# Patient Record
Sex: Male | Born: 1960 | Race: Black or African American | Hispanic: No | Smoking: Never smoker
Health system: Southern US, Community
[De-identification: ages and names within clinical notes are randomized; demographics above are authoritative.]

## PROBLEM LIST (undated history)

## (undated) DIAGNOSIS — I639 Cerebral infarction, unspecified: Secondary | ICD-10-CM

## (undated) DIAGNOSIS — J449 Chronic obstructive pulmonary disease, unspecified: Secondary | ICD-10-CM

## (undated) DIAGNOSIS — I2510 Atherosclerotic heart disease of native coronary artery without angina pectoris: Secondary | ICD-10-CM

## (undated) DIAGNOSIS — Z955 Presence of coronary angioplasty implant and graft: Secondary | ICD-10-CM

## (undated) HISTORY — PX: CORONARY STENT PLACEMENT: SHX1402

---

## 2001-03-03 ENCOUNTER — Emergency Department (HOSPITAL_COMMUNITY): Admission: EM | Admit: 2001-03-03 | Discharge: 2001-03-03 | Payer: Self-pay | Admitting: Emergency Medicine

## 2001-04-01 ENCOUNTER — Inpatient Hospital Stay (HOSPITAL_COMMUNITY): Admission: EM | Admit: 2001-04-01 | Discharge: 2001-04-03 | Payer: Self-pay | Admitting: Emergency Medicine

## 2001-04-01 ENCOUNTER — Encounter: Payer: Self-pay | Admitting: Emergency Medicine

## 2001-04-02 ENCOUNTER — Encounter: Payer: Self-pay | Admitting: Pulmonary Disease

## 2001-04-03 ENCOUNTER — Encounter: Payer: Self-pay | Admitting: Pulmonary Disease

## 2001-10-03 ENCOUNTER — Emergency Department (HOSPITAL_COMMUNITY): Admission: EM | Admit: 2001-10-03 | Discharge: 2001-10-03 | Payer: Self-pay | Admitting: Emergency Medicine

## 2002-04-09 ENCOUNTER — Emergency Department (HOSPITAL_COMMUNITY): Admission: EM | Admit: 2002-04-09 | Discharge: 2002-04-10 | Payer: Self-pay | Admitting: Emergency Medicine

## 2002-04-10 ENCOUNTER — Encounter: Payer: Self-pay | Admitting: Emergency Medicine

## 2002-04-29 ENCOUNTER — Ambulatory Visit (HOSPITAL_COMMUNITY): Admission: RE | Admit: 2002-04-29 | Discharge: 2002-04-29 | Payer: Self-pay | Admitting: Critical Care Medicine

## 2002-04-29 ENCOUNTER — Encounter: Payer: Self-pay | Admitting: Critical Care Medicine

## 2002-05-12 ENCOUNTER — Encounter: Payer: Self-pay | Admitting: Thoracic Surgery

## 2002-05-12 ENCOUNTER — Ambulatory Visit (HOSPITAL_COMMUNITY): Admission: RE | Admit: 2002-05-12 | Discharge: 2002-05-12 | Payer: Self-pay | Admitting: Thoracic Surgery

## 2002-08-18 ENCOUNTER — Inpatient Hospital Stay (HOSPITAL_COMMUNITY): Admission: EM | Admit: 2002-08-18 | Discharge: 2002-08-22 | Payer: Self-pay | Admitting: *Deleted

## 2002-08-18 ENCOUNTER — Encounter: Payer: Self-pay | Admitting: Emergency Medicine

## 2002-08-18 ENCOUNTER — Encounter: Payer: Self-pay | Admitting: Critical Care Medicine

## 2002-08-19 ENCOUNTER — Encounter: Payer: Self-pay | Admitting: Gastroenterology

## 2002-08-19 ENCOUNTER — Encounter: Payer: Self-pay | Admitting: Critical Care Medicine

## 2002-09-01 ENCOUNTER — Emergency Department (HOSPITAL_COMMUNITY): Admission: RE | Admit: 2002-09-01 | Discharge: 2002-09-01 | Payer: Self-pay | Admitting: Critical Care Medicine

## 2002-09-01 ENCOUNTER — Encounter: Payer: Self-pay | Admitting: Emergency Medicine

## 2002-09-01 ENCOUNTER — Encounter: Payer: Self-pay | Admitting: Critical Care Medicine

## 2002-09-06 ENCOUNTER — Encounter: Payer: Self-pay | Admitting: Emergency Medicine

## 2002-09-06 ENCOUNTER — Emergency Department (HOSPITAL_COMMUNITY): Admission: EM | Admit: 2002-09-06 | Discharge: 2002-09-06 | Payer: Self-pay | Admitting: Emergency Medicine

## 2002-09-07 ENCOUNTER — Inpatient Hospital Stay (HOSPITAL_COMMUNITY): Admission: EM | Admit: 2002-09-07 | Discharge: 2002-09-08 | Payer: Self-pay | Admitting: Emergency Medicine

## 2002-09-07 ENCOUNTER — Encounter: Payer: Self-pay | Admitting: *Deleted

## 2002-09-27 ENCOUNTER — Emergency Department (HOSPITAL_COMMUNITY): Admission: EM | Admit: 2002-09-27 | Discharge: 2002-09-28 | Payer: Self-pay | Admitting: Emergency Medicine

## 2002-09-28 ENCOUNTER — Encounter: Payer: Self-pay | Admitting: Emergency Medicine

## 2002-10-10 ENCOUNTER — Emergency Department (HOSPITAL_COMMUNITY): Admission: EM | Admit: 2002-10-10 | Discharge: 2002-10-10 | Payer: Self-pay | Admitting: *Deleted

## 2002-10-10 ENCOUNTER — Encounter: Payer: Self-pay | Admitting: *Deleted

## 2003-02-14 ENCOUNTER — Encounter: Payer: Self-pay | Admitting: *Deleted

## 2003-02-14 ENCOUNTER — Emergency Department (HOSPITAL_COMMUNITY): Admission: AD | Admit: 2003-02-14 | Discharge: 2003-02-14 | Payer: Self-pay | Admitting: *Deleted

## 2003-02-15 ENCOUNTER — Emergency Department (HOSPITAL_COMMUNITY): Admission: EM | Admit: 2003-02-15 | Discharge: 2003-02-15 | Payer: Self-pay | Admitting: Emergency Medicine

## 2003-03-21 ENCOUNTER — Emergency Department (HOSPITAL_COMMUNITY): Admission: EM | Admit: 2003-03-21 | Discharge: 2003-03-22 | Payer: Self-pay | Admitting: *Deleted

## 2003-03-21 ENCOUNTER — Encounter: Payer: Self-pay | Admitting: *Deleted

## 2003-03-22 ENCOUNTER — Encounter: Payer: Self-pay | Admitting: Emergency Medicine

## 2003-03-22 ENCOUNTER — Emergency Department (HOSPITAL_COMMUNITY): Admission: EM | Admit: 2003-03-22 | Discharge: 2003-03-22 | Payer: Self-pay | Admitting: Emergency Medicine

## 2003-03-22 ENCOUNTER — Encounter: Payer: Self-pay | Admitting: *Deleted

## 2003-04-12 ENCOUNTER — Encounter: Payer: Self-pay | Admitting: Emergency Medicine

## 2003-04-12 ENCOUNTER — Observation Stay (HOSPITAL_COMMUNITY): Admission: EM | Admit: 2003-04-12 | Discharge: 2003-04-13 | Payer: Self-pay | Admitting: Emergency Medicine

## 2003-04-13 ENCOUNTER — Emergency Department (HOSPITAL_COMMUNITY): Admission: EM | Admit: 2003-04-13 | Discharge: 2003-04-13 | Payer: Self-pay

## 2003-04-26 ENCOUNTER — Encounter: Admission: RE | Admit: 2003-04-26 | Discharge: 2003-04-26 | Payer: Self-pay | Admitting: Occupational Medicine

## 2003-11-21 ENCOUNTER — Inpatient Hospital Stay (HOSPITAL_COMMUNITY): Admission: EM | Admit: 2003-11-21 | Discharge: 2003-11-22 | Payer: Self-pay | Admitting: Emergency Medicine

## 2003-11-28 ENCOUNTER — Encounter: Admission: RE | Admit: 2003-11-28 | Discharge: 2003-11-28 | Payer: Self-pay | Admitting: Internal Medicine

## 2003-12-17 ENCOUNTER — Emergency Department (HOSPITAL_COMMUNITY): Admission: EM | Admit: 2003-12-17 | Discharge: 2003-12-17 | Payer: Self-pay | Admitting: Emergency Medicine

## 2004-01-03 ENCOUNTER — Ambulatory Visit (HOSPITAL_COMMUNITY): Admission: RE | Admit: 2004-01-03 | Discharge: 2004-01-03 | Payer: Self-pay | Admitting: General Surgery

## 2004-01-03 ENCOUNTER — Ambulatory Visit (HOSPITAL_BASED_OUTPATIENT_CLINIC_OR_DEPARTMENT_OTHER): Admission: RE | Admit: 2004-01-03 | Discharge: 2004-01-03 | Payer: Self-pay | Admitting: General Surgery

## 2004-03-16 ENCOUNTER — Emergency Department (HOSPITAL_COMMUNITY): Admission: EM | Admit: 2004-03-16 | Discharge: 2004-03-17 | Payer: Self-pay | Admitting: Emergency Medicine

## 2004-10-22 ENCOUNTER — Emergency Department (HOSPITAL_COMMUNITY): Admission: EM | Admit: 2004-10-22 | Discharge: 2004-10-22 | Payer: Self-pay | Admitting: Emergency Medicine

## 2006-02-02 ENCOUNTER — Encounter (HOSPITAL_COMMUNITY): Admission: RE | Admit: 2006-02-02 | Discharge: 2006-03-04 | Payer: Self-pay | Admitting: Cardiology

## 2006-02-17 ENCOUNTER — Inpatient Hospital Stay (HOSPITAL_COMMUNITY): Admission: EM | Admit: 2006-02-17 | Discharge: 2006-02-18 | Payer: Self-pay | Admitting: *Deleted

## 2006-04-06 ENCOUNTER — Emergency Department (HOSPITAL_COMMUNITY): Admission: EM | Admit: 2006-04-06 | Discharge: 2006-04-06 | Payer: Self-pay | Admitting: Emergency Medicine

## 2006-08-31 ENCOUNTER — Inpatient Hospital Stay (HOSPITAL_COMMUNITY): Admission: EM | Admit: 2006-08-31 | Discharge: 2006-09-03 | Payer: Self-pay | Admitting: Emergency Medicine

## 2017-10-04 ENCOUNTER — Encounter (HOSPITAL_COMMUNITY): Payer: Self-pay | Admitting: Emergency Medicine

## 2017-10-04 ENCOUNTER — Other Ambulatory Visit: Payer: Self-pay

## 2017-10-04 DIAGNOSIS — Z23 Encounter for immunization: Secondary | ICD-10-CM | POA: Diagnosis not present

## 2017-10-04 DIAGNOSIS — S41112A Laceration without foreign body of left upper arm, initial encounter: Secondary | ICD-10-CM | POA: Insufficient documentation

## 2017-10-04 DIAGNOSIS — R55 Syncope and collapse: Secondary | ICD-10-CM | POA: Insufficient documentation

## 2017-10-04 DIAGNOSIS — J449 Chronic obstructive pulmonary disease, unspecified: Secondary | ICD-10-CM | POA: Diagnosis not present

## 2017-10-04 DIAGNOSIS — I2510 Atherosclerotic heart disease of native coronary artery without angina pectoris: Secondary | ICD-10-CM | POA: Diagnosis not present

## 2017-10-04 DIAGNOSIS — Y92410 Unspecified street and highway as the place of occurrence of the external cause: Secondary | ICD-10-CM | POA: Diagnosis not present

## 2017-10-04 DIAGNOSIS — R079 Chest pain, unspecified: Secondary | ICD-10-CM | POA: Diagnosis not present

## 2017-10-04 DIAGNOSIS — Z79899 Other long term (current) drug therapy: Secondary | ICD-10-CM | POA: Insufficient documentation

## 2017-10-04 DIAGNOSIS — R062 Wheezing: Secondary | ICD-10-CM | POA: Diagnosis not present

## 2017-10-04 DIAGNOSIS — N289 Disorder of kidney and ureter, unspecified: Secondary | ICD-10-CM | POA: Diagnosis not present

## 2017-10-04 DIAGNOSIS — Y9389 Activity, other specified: Secondary | ICD-10-CM | POA: Insufficient documentation

## 2017-10-04 DIAGNOSIS — Z7902 Long term (current) use of antithrombotics/antiplatelets: Secondary | ICD-10-CM | POA: Insufficient documentation

## 2017-10-04 DIAGNOSIS — Y999 Unspecified external cause status: Secondary | ICD-10-CM | POA: Insufficient documentation
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
## 2017-10-05 ENCOUNTER — Emergency Department (HOSPITAL_COMMUNITY): Payer: Medicare Other

## 2017-10-05 ENCOUNTER — Encounter (HOSPITAL_COMMUNITY): Payer: Self-pay | Admitting: Emergency Medicine

## 2017-10-05 ENCOUNTER — Emergency Department (HOSPITAL_COMMUNITY)
Admission: EM | Admit: 2017-10-05 | Discharge: 2017-10-05 | Disposition: A | Discharge status: Home / Self Care | Payer: Medicare Other | Attending: Emergency Medicine | Admitting: Emergency Medicine

## 2017-10-05 DIAGNOSIS — N289 Disorder of kidney and ureter, unspecified: Secondary | ICD-10-CM

## 2017-10-05 DIAGNOSIS — S41112A Laceration without foreign body of left upper arm, initial encounter: Secondary | ICD-10-CM

## 2017-10-05 DIAGNOSIS — R55 Syncope and collapse: Secondary | ICD-10-CM | POA: Diagnosis not present

## 2017-10-05 HISTORY — DX: Chronic obstructive pulmonary disease, unspecified: J44.9

## 2017-10-05 HISTORY — DX: Cerebral infarction, unspecified: I63.9

## 2017-10-05 HISTORY — DX: Atherosclerotic heart disease of native coronary artery without angina pectoris: I25.10

## 2017-10-05 HISTORY — DX: Presence of coronary angioplasty implant and graft: Z95.5

## 2017-10-05 LAB — CBC WITH DIFFERENTIAL/PLATELET
Basophils Absolute: 0 10*3/uL (ref 0.0–0.1)
Basophils Relative: 0 %
Eosinophils Absolute: 0.1 10*3/uL (ref 0.0–0.7)
Eosinophils Relative: 2 %
HEMATOCRIT: 44.7 % (ref 39.0–52.0)
HEMOGLOBIN: 15.4 g/dL (ref 13.0–17.0)
LYMPHS ABS: 0.8 10*3/uL (ref 0.7–4.0)
LYMPHS PCT: 16 %
MCH: 29.8 pg (ref 26.0–34.0)
MCHC: 34.5 g/dL (ref 30.0–36.0)
MCV: 86.5 fL (ref 78.0–100.0)
MONOS PCT: 15 %
Monocytes Absolute: 0.7 10*3/uL (ref 0.1–1.0)
NEUTROS ABS: 3.1 10*3/uL (ref 1.7–7.7)
Neutrophils Relative %: 67 %
Platelets: 174 10*3/uL (ref 150–400)
RBC: 5.17 MIL/uL (ref 4.22–5.81)
RDW: 13.7 % (ref 11.5–15.5)
WBC: 4.7 10*3/uL (ref 4.0–10.5)

## 2017-10-05 LAB — I-STAT CHEM 8, ED
BUN: 34 mg/dL — AB (ref 6–20)
CHLORIDE: 98 mmol/L — AB (ref 101–111)
CREATININE: 1.9 mg/dL — AB (ref 0.61–1.24)
Calcium, Ion: 1.14 mmol/L — ABNORMAL LOW (ref 1.15–1.40)
Glucose, Bld: 165 mg/dL — ABNORMAL HIGH (ref 65–99)
HCT: 45 % (ref 39.0–52.0)
Hemoglobin: 15.3 g/dL (ref 13.0–17.0)
Potassium: 3.2 mmol/L — ABNORMAL LOW (ref 3.5–5.1)
SODIUM: 139 mmol/L (ref 135–145)
TCO2: 30 mmol/L (ref 22–32)

## 2017-10-05 LAB — I-STAT TROPONIN, ED
Troponin i, poc: 0 ng/mL (ref 0.00–0.08)
Troponin i, poc: 0 ng/mL (ref 0.00–0.08)

## 2017-10-05 LAB — RAPID URINE DRUG SCREEN, HOSP PERFORMED
AMPHETAMINES: NOT DETECTED
Amphetamines: NOT DETECTED
BARBITURATES: NOT DETECTED
BARBITURATES: NOT DETECTED
BENZODIAZEPINES: NOT DETECTED
BENZODIAZEPINES: NOT DETECTED
COCAINE: NOT DETECTED
COCAINE: NOT DETECTED
Opiates: NOT DETECTED
Opiates: NOT DETECTED
TETRAHYDROCANNABINOL: NOT DETECTED
Tetrahydrocannabinol: NOT DETECTED

## 2017-10-05 LAB — ETHANOL: Alcohol, Ethyl (B): <10 mg/dL (ref <10)

## 2017-10-05 MED ORDER — IPRATROPIUM-ALBUTEROL 0.5-2.5 (3) MG/3ML IN SOLN
3.0000 mL | Freq: Once | RESPIRATORY_TRACT | Status: AC
  Administered 2017-10-05: 3 mL via RESPIRATORY_TRACT
  Filled 2017-10-05: qty 3

## 2017-10-05 MED ORDER — LIDOCAINE 5 % EX PTCH: 1.0000 | MEDICATED_PATCH | CUTANEOUS | 0 refills | Status: AC

## 2017-10-05 MED ORDER — LIDOCAINE-EPINEPHRINE-TETRACAINE (LET) SOLUTION
3.0000 mL | Freq: Once | NASAL | Status: AC
  Administered 2017-10-05: 04:00:00 3 mL via TOPICAL
  Filled 2017-10-05: qty 3

## 2017-10-05 MED ORDER — METHYLPREDNISOLONE SODIUM SUCC 125 MG IJ SOLR
125.0000 mg | Freq: Once | INTRAMUSCULAR | Status: AC
Start: 2017-10-05 — End: 2017-10-05
  Administered 2017-10-05: 125 mg via INTRAVENOUS
  Filled 2017-10-05: qty 2

## 2017-10-05 MED ORDER — TETANUS-DIPHTH-ACELL PERTUSSIS 5-2.5-18.5 LF-MCG/0.5 IM SUSP
0.5000 mL | Freq: Once | INTRAMUSCULAR | Status: AC
  Administered 2017-10-05: 0.5 mL via INTRAMUSCULAR
  Filled 2017-10-05: qty 0.5

## 2017-10-05 MED ORDER — ALBUTEROL SULFATE (2.5 MG/3ML) 0.083% IN NEBU
5.0000 mg | INHALATION_SOLUTION | Freq: Once | RESPIRATORY_TRACT | Status: AC
  Administered 2017-10-05: 5 mg via RESPIRATORY_TRACT
  Filled 2017-10-05: qty 6

## 2017-10-05 MED ORDER — LIDOCAINE 5 % EX PTCH
1.0000 | MEDICATED_PATCH | CUTANEOUS | Status: DC
  Administered 2017-10-05: 1 via TRANSDERMAL
  Filled 2017-10-05: qty 1

## 2017-10-05 MED ORDER — ACETAMINOPHEN 500 MG PO TABS
1000.0000 mg | ORAL_TABLET | Freq: Once | ORAL | Status: AC
  Administered 2017-10-05: 1000 mg via ORAL
  Filled 2017-10-05: qty 2

## 2017-10-05 NOTE — Progress Notes (Signed)
Pt refusing ABG at this time.  
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
